# Patient Record
Sex: Female | Born: 2005 | Race: Black or African American | Hispanic: No | Marital: Single | Smoking: Never smoker
Health system: Southern US, Community
[De-identification: ages and names within clinical notes are randomized; demographics above are authoritative.]

## PROBLEM LIST (undated history)

## (undated) DIAGNOSIS — R17 Unspecified jaundice: Secondary | ICD-10-CM

## (undated) DIAGNOSIS — L309 Dermatitis, unspecified: Secondary | ICD-10-CM

## (undated) HISTORY — DX: Unspecified jaundice: R17

## (undated) HISTORY — DX: Dermatitis, unspecified: L30.9

---
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
## 2023-10-15 ENCOUNTER — Emergency Department (HOSPITAL_BASED_OUTPATIENT_CLINIC_OR_DEPARTMENT_OTHER)

## 2023-10-15 ENCOUNTER — Other Ambulatory Visit: Payer: Self-pay

## 2023-10-15 ENCOUNTER — Encounter (HOSPITAL_BASED_OUTPATIENT_CLINIC_OR_DEPARTMENT_OTHER): Payer: Self-pay | Admitting: Emergency Medicine

## 2023-10-15 ENCOUNTER — Emergency Department (HOSPITAL_BASED_OUTPATIENT_CLINIC_OR_DEPARTMENT_OTHER)
Admission: EM | Admit: 2023-10-15 | Discharge: 2023-10-15 | Disposition: A | Discharge status: Home / Self Care | Attending: Emergency Medicine | Admitting: Emergency Medicine

## 2023-10-15 DIAGNOSIS — M25511 Pain in right shoulder: Secondary | ICD-10-CM | POA: Insufficient documentation

## 2023-10-15 NOTE — ED Triage Notes (Signed)
 Pt reports RT shoulder pain x 1 wk, no injury; also reports neck and back pain x months

## 2023-10-15 NOTE — Discharge Instructions (Signed)
Contact a health care provider if: Your pain is not relieved with medicines. New pain develops in your arm, hand, or fingers. You loosen your sling and your arm, hand, or fingers remain tingly, numb, swollen, or painful. Get help right away if: Your arm, hand, or fingers turn white or blue. 

## 2023-10-15 NOTE — ED Provider Notes (Signed)
 [place] Provider Note   CSN: [id number] Arrival date & time: 10/15/23  1807     History  Chief Complaint  Patient presents with   Shoulder Pain    Melody Elliott is a 18 y.o. female who presents emergency department with a chief complaint of shoulder pain on the right side.  She complains of pain and stiffness.  No known injuries.  Seems to be worse in the morning.  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  No numbness tingling or weakness noted.  She has been using diclofenac gel without relief   Shoulder Pain      Home Medications Prior to Admission medications   Medication Sig Start Date End Date Taking? Authorizing Provider  albuterol (PROVENTIL HFA;VENTOLIN HFA) 108 (90 BASE) MCG/ACT inhaler Inhale 2 puffs into the lungs every 6 (six) hours as needed for wheezing or shortness of breath. 06/28/13   Le, Thao P, DO  azithromycin (ZITHROMAX) 200 MG/5ML suspension 10 ml, then 5 ml daily 06/28/13   Le, Thao P, DO  diphenhydrAMINE (BENADRYL) 12.5 MG/5ML liquid Take by mouth 4 (four) times daily as needed.    [provider]  FIBER SELECT GUMMIES PO Take by mouth.    [provider]  prednisoLONE (PRELONE) 15 MG/5ML syrup 10 ml daily 06/28/13   Le, Thao P, DO  triamcinolone cream (KENALOG) 0.1 % Apply 1 application topically daily. 06/28/13   Le, Thao P, DO      Allergies    Patient has no known allergies.    Review of Systems   Review of Systems  Physical Exam Updated Vital Signs BP (!) 147/80 (BP Location: Right Arm)   Pulse 86   Temp 98.2 F (36.8 C) (Oral)   Resp 15   Ht 5\' 7"  (1.702 m)   Wt 99.8 kg   LMP 09/07/2023   SpO2 99%   BMI 34.46 kg/m  Physical Exam Vitals and nursing note reviewed.  Constitutional:      General: She is not in acute distress.    Appearance: She is well-developed. She is not diaphoretic.  HENT:     Head: Normocephalic  and atraumatic.     Right Ear: External ear normal.     Left Ear: External ear normal.     Nose: Nose normal.     Mouth/Throat:     Mouth: Mucous membranes are moist.  Eyes:     General: No scleral icterus.    Conjunctiva/sclera: Conjunctivae normal.  Cardiovascular:     Rate and Rhythm: Normal rate and regular rhythm.     Heart sounds: Normal heart sounds. No murmur heard.    No friction rub. No gallop.  Pulmonary:     Effort: Pulmonary effort is normal. No respiratory distress.     Breath sounds: Normal breath sounds.  Abdominal:     General: Bowel sounds are normal. There is no distension.     Palpations: Abdomen is soft. There is no mass.     Tenderness: There is no abdominal tenderness. There is no guarding.  Musculoskeletal:     Cervical back: Normal range of motion.     Comments: Full range of motion of the right shoulder, no significant pain weakness of the shoulder, normal ipsilateral elbow wrist examination.  Skin:    General: Skin is warm and dry.  Neurological:     Mental Status: She is  alert and oriented to person, place, and time.  Psychiatric:        Behavior: Behavior normal.     ED Results / Procedures / Treatments   Labs (all labs ordered are listed, but only abnormal results are displayed) Labs Reviewed - No data to display  EKG None  Radiology DG Shoulder Right Result Date: 10/15/2023 CLINICAL DATA:  pain EXAM: RIGHT SHOULDER - 2+ VIEW COMPARISON:  None Available. FINDINGS: There is no evidence of fracture or dislocation. There is no evidence of arthropathy or other focal bone abnormality. Soft tissues are unremarkable. IMPRESSION: Negative. Electronically Signed   By: Tish Frederickson M.D.   On: 10/15/2023 20:01    Procedures Procedures    Medications Ordered in ED Medications - No data to display  ED Course/ Medical Decision Making/ A&P                                 Medical Decision Making Amount and/or Complexity of Data  Reviewed Radiology: ordered.   Patient with shoulder pain on the right, no significant inhibitions noted.  I visualized and interpreted right shoulder x-ray which shows no acute findings.  Patient may follow-up with orthopedics for further evaluation.  Suggest oral NSAIDs.  RICE protocol, follow-up with orthopedics discussed return precautions.        Final Clinical Impression(s) / ED Diagnoses Final diagnoses:  None    Rx / DC Orders ED Discharge Orders     None         Arthor Captain, PA-C 10/15/23 2031    Tegeler, Canary Brim, MD 10/15/23 [phone]
